# Patient Record
Sex: Male | Born: 1945 | Race: White | Hispanic: No | Marital: Married | Smoking: Former smoker
Health system: Southern US, Community
[De-identification: ages and names within clinical notes are randomized; demographics above are authoritative.]

## PROBLEM LIST (undated history)

## (undated) DIAGNOSIS — I10 Essential (primary) hypertension: Secondary | ICD-10-CM

## (undated) DIAGNOSIS — I639 Cerebral infarction, unspecified: Secondary | ICD-10-CM

## (undated) DIAGNOSIS — E119 Type 2 diabetes mellitus without complications: Secondary | ICD-10-CM

## (undated) DIAGNOSIS — C801 Malignant (primary) neoplasm, unspecified: Secondary | ICD-10-CM

## (undated) DIAGNOSIS — J449 Chronic obstructive pulmonary disease, unspecified: Secondary | ICD-10-CM

## (undated) HISTORY — PX: BACK SURGERY: SHX140

## (undated) HISTORY — PX: COLON SURGERY: SHX602

## (undated) HISTORY — PX: CARDIAC SURGERY: SHX584

---

## 2015-06-12 ENCOUNTER — Encounter (HOSPITAL_COMMUNITY): Payer: Self-pay | Admitting: Family Medicine

## 2015-06-12 ENCOUNTER — Emergency Department (HOSPITAL_COMMUNITY): Payer: Medicare Other

## 2015-06-12 ENCOUNTER — Emergency Department (HOSPITAL_COMMUNITY)
Admission: EM | Admit: 2015-06-12 | Discharge: 2015-06-12 | Disposition: A | Discharge status: Home / Self Care | Payer: Medicare Other | Attending: Emergency Medicine | Admitting: Emergency Medicine

## 2015-06-12 DIAGNOSIS — Y93K1 Activity, walking an animal: Secondary | ICD-10-CM | POA: Diagnosis not present

## 2015-06-12 DIAGNOSIS — Z87891 Personal history of nicotine dependence: Secondary | ICD-10-CM | POA: Diagnosis not present

## 2015-06-12 DIAGNOSIS — Y9289 Other specified places as the place of occurrence of the external cause: Secondary | ICD-10-CM | POA: Diagnosis not present

## 2015-06-12 DIAGNOSIS — I10 Essential (primary) hypertension: Secondary | ICD-10-CM | POA: Insufficient documentation

## 2015-06-12 DIAGNOSIS — R040 Epistaxis: Secondary | ICD-10-CM | POA: Insufficient documentation

## 2015-06-12 DIAGNOSIS — S62512A Displaced fracture of proximal phalanx of left thumb, initial encounter for closed fracture: Secondary | ICD-10-CM | POA: Insufficient documentation

## 2015-06-12 DIAGNOSIS — S29001A Unspecified injury of muscle and tendon of front wall of thorax, initial encounter: Secondary | ICD-10-CM | POA: Diagnosis present

## 2015-06-12 DIAGNOSIS — J449 Chronic obstructive pulmonary disease, unspecified: Secondary | ICD-10-CM | POA: Diagnosis not present

## 2015-06-12 DIAGNOSIS — E119 Type 2 diabetes mellitus without complications: Secondary | ICD-10-CM | POA: Diagnosis not present

## 2015-06-12 DIAGNOSIS — Z859 Personal history of malignant neoplasm, unspecified: Secondary | ICD-10-CM | POA: Diagnosis not present

## 2015-06-12 DIAGNOSIS — S20211A Contusion of right front wall of thorax, initial encounter: Secondary | ICD-10-CM | POA: Insufficient documentation

## 2015-06-12 DIAGNOSIS — Y998 Other external cause status: Secondary | ICD-10-CM | POA: Diagnosis not present

## 2015-06-12 DIAGNOSIS — S80211A Abrasion, right knee, initial encounter: Secondary | ICD-10-CM | POA: Diagnosis not present

## 2015-06-12 DIAGNOSIS — W19XXXA Unspecified fall, initial encounter: Secondary | ICD-10-CM

## 2015-06-12 DIAGNOSIS — S0990XA Unspecified injury of head, initial encounter: Secondary | ICD-10-CM | POA: Diagnosis not present

## 2015-06-12 DIAGNOSIS — W01198A Fall on same level from slipping, tripping and stumbling with subsequent striking against other object, initial encounter: Secondary | ICD-10-CM | POA: Insufficient documentation

## 2015-06-12 DIAGNOSIS — S50311A Abrasion of right elbow, initial encounter: Secondary | ICD-10-CM | POA: Insufficient documentation

## 2015-06-12 DIAGNOSIS — S62502A Fracture of unspecified phalanx of left thumb, initial encounter for closed fracture: Secondary | ICD-10-CM

## 2015-06-12 HISTORY — DX: Essential (primary) hypertension: I10

## 2015-06-12 HISTORY — DX: Malignant (primary) neoplasm, unspecified: C80.1

## 2015-06-12 HISTORY — DX: Type 2 diabetes mellitus without complications: E11.9

## 2015-06-12 HISTORY — DX: Chronic obstructive pulmonary disease, unspecified: J44.9

## 2015-06-12 HISTORY — DX: Cerebral infarction, unspecified: I63.9

## 2015-06-12 NOTE — Discharge Instructions (Signed)
Chest Contusion A chest contusion is a deep bruise on your chest area. Contusions are the result of an injury that caused bleeding under the skin. A chest contusion may involve bruising of the skin, muscles, or ribs. The contusion may turn blue, purple, or yellow. Minor injuries will give you a painless contusion, but more severe contusions may stay painful and swollen for a few weeks. CAUSES  A contusion is usually caused by a blow, trauma, or direct force to an area of the body. SYMPTOMS   Swelling and redness of the injured area.  Discoloration of the injured area.  Tenderness and soreness of the injured area.  Pain. DIAGNOSIS  The diagnosis can be made by taking a history and performing a physical exam. An X-ray, CT scan, or MRI may be needed to determine if there were any associated injuries, such as broken bones (fractures) or internal injuries. TREATMENT  Often, the best treatment for a chest contusion is resting, icing, and applying cold compresses to the injured area. Deep breathing exercises may be recommended to reduce the risk of pneumonia. Over-the-counter medicines may also be recommended for pain control. HOME CARE INSTRUCTIONS   Put ice on the injured area.  Put ice in a plastic bag.  Place a towel between your skin and the bag.  Leave the ice on for 15-20 minutes, 03-04 times a day.  Only take over-the-counter or prescription medicines as directed by your caregiver. Your caregiver may recommend avoiding anti-inflammatory medicines (aspirin, ibuprofen, and naproxen) for 48 hours because these medicines may increase bruising.  Rest the injured area.  Perform deep-breathing exercises as directed by your caregiver.  Stop smoking if you smoke.  Do not lift objects over 5 pounds (2.3 kg) for 3 days or longer if recommended by your caregiver. SEEK IMMEDIATE MEDICAL CARE IF:   You have increased bruising or swelling.  You have pain that is getting worse.  You have  difficulty breathing.  You have dizziness, weakness, or fainting.  You have blood in your urine or stool.  You cough up or vomit blood.  Your swelling or pain is not relieved with medicines. MAKE SURE YOU:   Understand these instructions.  Will watch your condition.  Will get help right away if you are not doing well or get worse.   This information is not intended to replace advice given to you by your health care provider. Make sure you discuss any questions you have with your health care provider.   Document Released: 10/12/2000 Document Revised: 10/12/2011 Document Reviewed: 07/11/2011 Elsevier Interactive Patient Education 2016 [employer]  

## 2015-06-12 NOTE — ED Notes (Signed)
Patient transported to X-ray 

## 2015-06-12 NOTE — Progress Notes (Signed)
Orthopedic Tech Progress Note Patient Details:  Jeffrey Ritter 1945/05/03 [id number]  Ortho Devices Type of Ortho Device: Thumb spica splint Splint Material: Plaster Ortho Device/Splint Interventions: Application   [place] Pink 06/12/2015, 12:40 PM

## 2015-06-12 NOTE — ED Provider Notes (Signed)
CSN: [id number]     Arrival date & time 06/12/15  0850 History   First MD Initiated Contact with Patient 06/12/15 [phone]     Chief Complaint  Patient presents with  . Fall     (Consider location/radiation/quality/duration/timing/severity/associated sxs/prior Treatment) Patient is a 70 y.o. male presenting with fall. The history is provided by the patient.  Fall This is a new (Patient is traveling from [place] to [facility] and they had stopped this morning at [facility] for breakfast and he was walking the dogs and they plan with him over causing him to fall face first into the pavement) problem. The current episode started less than 1 hour ago. The problem occurs constantly. The problem has not changed since onset.Associated symptoms include chest pain. Pertinent negatives include no abdominal pain, no headaches and no shortness of breath. Associated symptoms comments: Pain in the right lower anterior chest without shortness of breath. No headache, visual changes, LOC, nose pain. Left thumb pain and abrasions to the right elbow and knee. Nothing aggravates the symptoms. Nothing relieves the symptoms. He has tried nothing for the symptoms. The treatment provided no relief.    Past Medical History  Diagnosis Date  . Hypertension   . Diabetes mellitus without complication ([place])   . Stroke ([place])   . COPD (chronic obstructive pulmonary disease) ([place])   . Cancer [facility])    Past Surgical History  Procedure Laterality Date  . Cardiac surgery    . Back surgery    . Colon surgery     History reviewed. No pertinent family history. Social History  Substance Use Topics  . Smoking status: Former Research scientist (life sciences)  . Smokeless tobacco: None  . Alcohol Use: Yes    Review of Systems  Respiratory: Negative for shortness of breath.   Cardiovascular: Positive for chest pain.  Gastrointestinal: Negative for abdominal pain.  Neurological: Negative for headaches.  All other systems reviewed and are  negative.     Allergies  Review of patient's allergies indicates no known allergies.  Home Medications   Prior to Admission medications   Not on File   BP 127/76 mmHg  Pulse 51  Temp(Src) 98 F (36.7 C)  Resp 18  SpO2 98% Physical Exam  Constitutional: He is oriented to person, place, and time. He appears well-developed and well-nourished. No distress.  HENT:  Head: Normocephalic and atraumatic.  Mouth/Throat: Oropharynx is clear and moist.  Epistaxis which has spontaneously stopped out of bilateral nares  Eyes: Conjunctivae and EOM are normal. Pupils are equal, round, and reactive to light.  Neck: Normal range of motion. Neck supple. No spinous process tenderness and no muscular tenderness present.  Cardiovascular: Normal rate, regular rhythm and intact distal pulses.   No murmur heard. Pulmonary/Chest: Effort normal and breath sounds normal. No respiratory distress. He has no wheezes. He has no rales. He exhibits tenderness.    Abdominal: Soft. He exhibits no distension. There is no tenderness. There is no rebound and no guarding.  Musculoskeletal: Normal range of motion. He exhibits no edema.       Arms:      Left hand: He exhibits tenderness, bony tenderness and swelling. He exhibits no deformity.       Hands:      Legs: Superficial abrasions over the right elbow and knee with full range of motion of the joint without significant tenderness  Neurological: He is alert and oriented to person, place, and time.  Skin: Skin is warm and dry. No rash  noted. No erythema.  Psychiatric: He has a normal mood and affect. His behavior is normal.  Nursing note and vitals reviewed.   ED Course  Procedures (including critical care time) Labs Review Labs Reviewed - No data to display  Imaging Review Dg Ribs Unilateral W/chest Right  06/12/2015  CLINICAL DATA:  Tripped and fell.  Pain in the lateral right ribs. EXAM: RIGHT RIBS AND CHEST - 3+ VIEW COMPARISON:  None. FINDINGS:  There is a large curvilinear density in the mid and lower left lung. The left chest is more lucent lateral to this density. Findings could represent a large bulla formation but cannot exclude a pneumothorax at the left lung base. Otherwise, the lungs are clear. There is no evidence for right pneumothorax. Heart size is normal. There is an old fracture involving the right ninth rib. No evidence for an acute right rib fracture. IMPRESSION: No evidence for an acute right rib fracture. Old right ninth rib fracture. Lucency in the lateral left lower lung. Findings could be related to a large bulla formation but the cannot exclude a complex or loculated pneumothorax in this location. Recommend further characterization with CT or decubitus images. Electronically Signed   By: Markus Daft M.D.   On: 06/12/2015 10:16   Ct Head Wo Contrast  06/12/2015  CLINICAL DATA:  [place] and struck head on pavement. Complains of headache. EXAM: CT HEAD WITHOUT CONTRAST TECHNIQUE: Contiguous axial images were obtained from the base of the skull through the vertex without contrast. COMPARISON:  None FINDINGS: There is a large area of encephalomalacia in the right occipital lobe extending into the right parietal lobe. Findings compatible with old infarct. There is also ex vacuo dilatation of the posterior right lateral ventricle. Low-density in the white matter suggesting chronic changes. No evidence for acute hemorrhage, mass lesion, midline shift, hydrocephalus or large infarct. Mastoid air cells are aerated. Mild mucosal thickening in the mastoid air cells and sphenoid sinuses. No calvarial fracture. IMPRESSION: No acute intracranial abnormality. 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 Evidence for chronic small vessel ischemic changes. Electronically Signed   By: Markus Daft M.D.   On: 06/12/2015 10:22   Dg Chest Left Decubitus  06/12/2015  CLINICAL DATA:  Status post fall today with a chest injury and pain. Initial encounter. EXAM:  CHEST - LEFT DECUBITUS COMPARISON:  Plain films of the chest and right ribs this same day. FINDINGS: No pneumothorax is identified. Large bullous lesion in the right lower lung zone is noted. No pleural effusion. IMPRESSION: Negative for pneumothorax. Large bullous lesion right lower lung zone. Electronically Signed   By: Inge Rise M.D.   On: 06/12/2015 11:36   Dg Finger Thumb Left  06/12/2015  CLINICAL DATA:  Pain following fall EXAM: LEFT THUMB 2+V COMPARISON:  None. FINDINGS: Frontal, oblique, and lateral views were obtained. There is a small avulsion along the volar aspect of the proximal most portion of the first metacarpal with alignment near anatomic. No other fracture. No dislocation. No appreciable joint space narrowing. IMPRESSION: Avulsion along the volar aspect of the proximal most portion of the first metacarpal in near anatomic alignment. No other fracture. No dislocation. No apparent arthropathy. Electronically Signed   By: Lowella Grip III M.D.   On: 06/12/2015 10:16   I have personally reviewed and evaluated these images and lab results as part of my medical decision-making.   EKG Interpretation None      MDM   Final diagnoses:  Fall, initial encounter  Rib contusion, right, initial encounter  Thumb fracture, left, closed, initial encounter    Patient is a 70 year old male who had a mechanical fall today when he was pulled over while walking his dogs. He is complaining of into the right ribs and has abrasions to the right knee and elbow. Also he hit his face and forehead and he does take Eliquis.  Patient denies LOC and currently denies headache.  Patient was able to family without difficulty after the fall. He does complain of left thumb pain.   X-ray shows small avulsion fracture of the left thumb with a normal head CT. Chest x-ray to evaluate for rib fractures is negative on the right for rib fractures but concern for a bone marrow versus a complex pneumothorax  on the left. We will get a decubitus image however feel this is highly unlikely this patient has no shortness of breath no left-sided chest pain and is satting 98% on room air.  Tetanus shot is up-to-date and patient's wounds were cleaned and dressed.  Blanchie Dessert, MD 06/12/15 [phone]

## 2015-06-12 NOTE — ED Notes (Signed)
Pt here for fall this am. sts that he got tripped up on his dogs. sts struck face, bilateral arms and right knee. Denies LOC. sts slight headache. Pt on blood thinner.

## 2017-09-06 IMAGING — DX DG RIBS W/ CHEST 3+V*R*
3 series · 3 of 3 positions shown · non-contrast
Comparison: None.

CLINICAL DATA: Tripped and fell.  Pain in the lateral right ribs.

EXAM:
RIGHT RIBS AND CHEST - 3+ VIEW

[x chest ap]
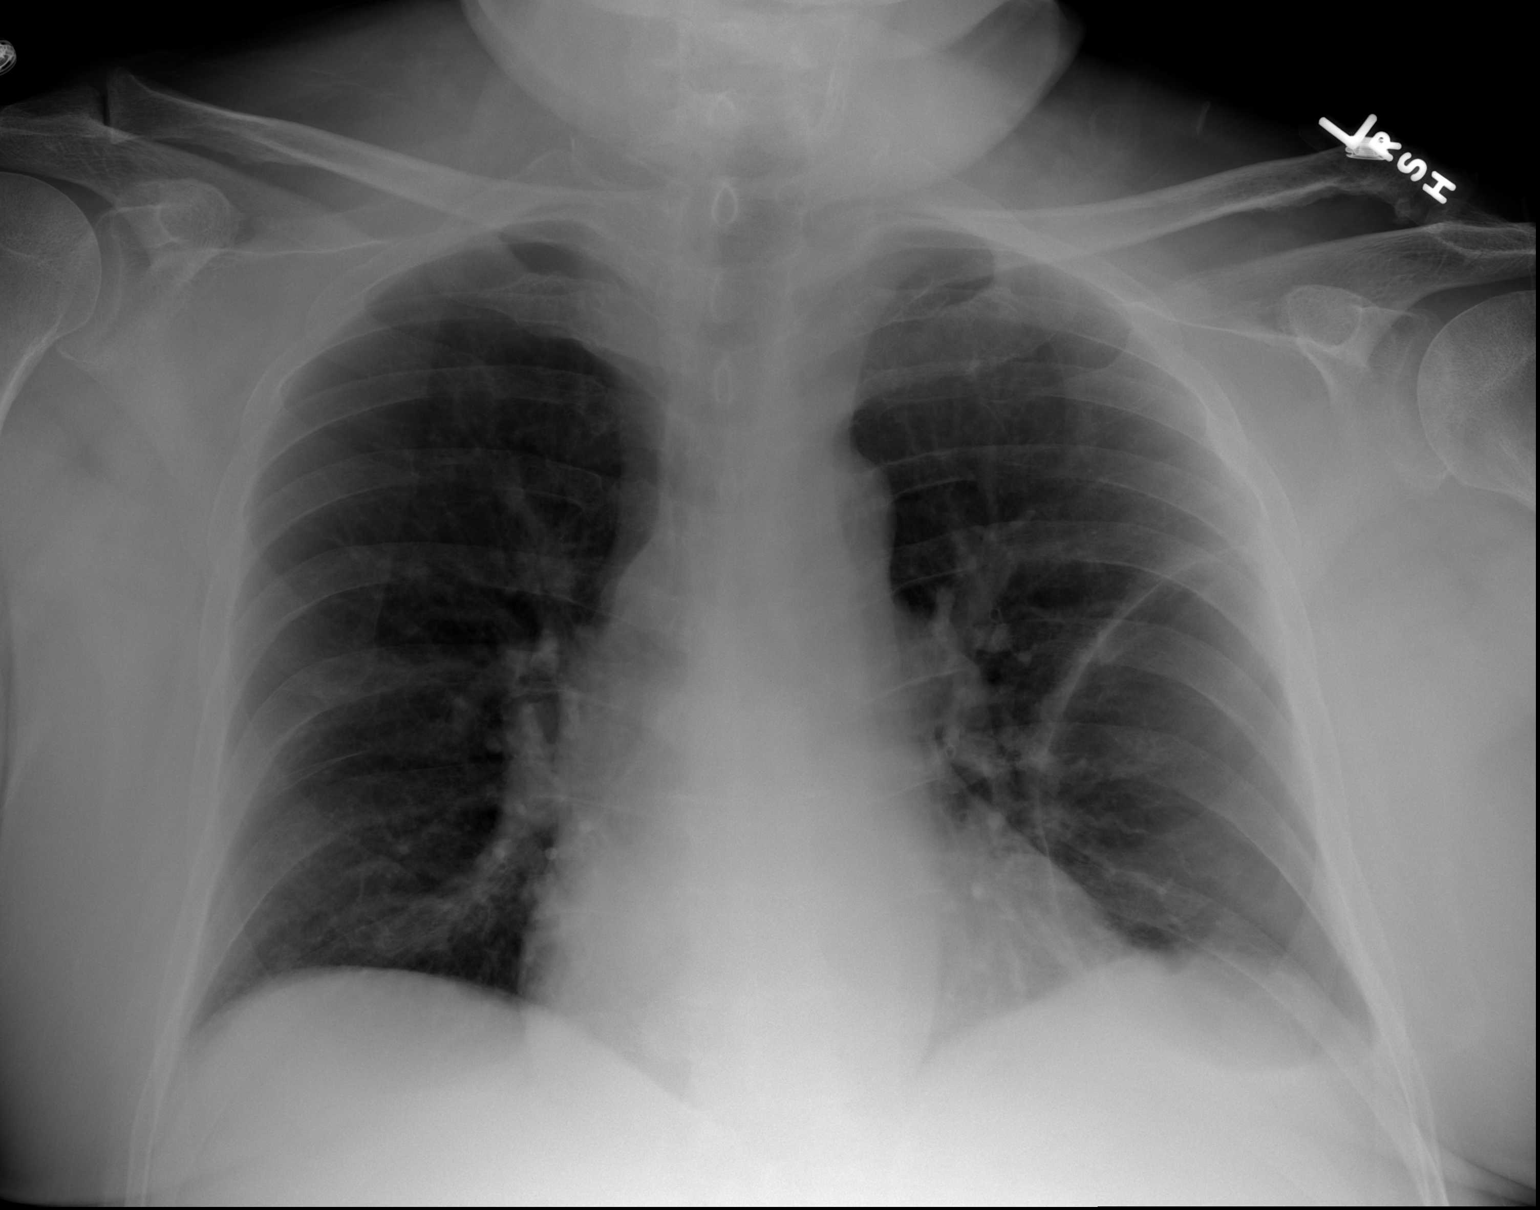

[t ribs ap upper right]
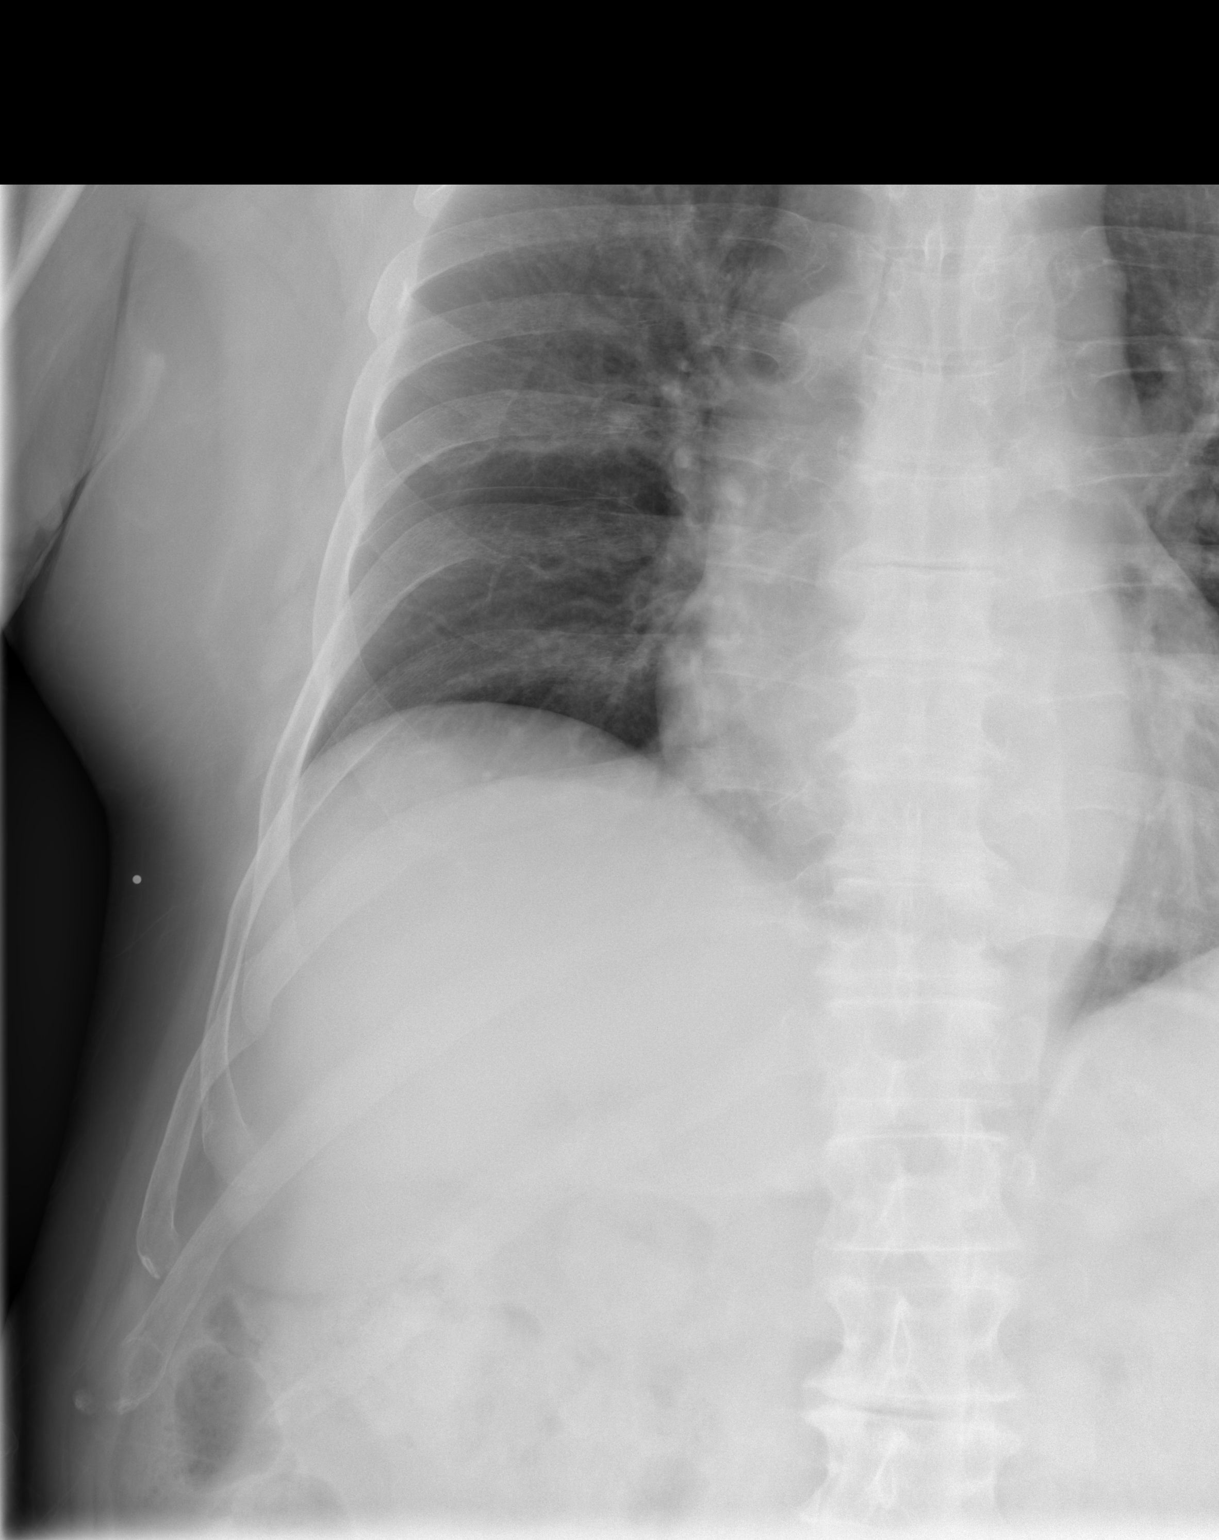

[t ribs rpo right]
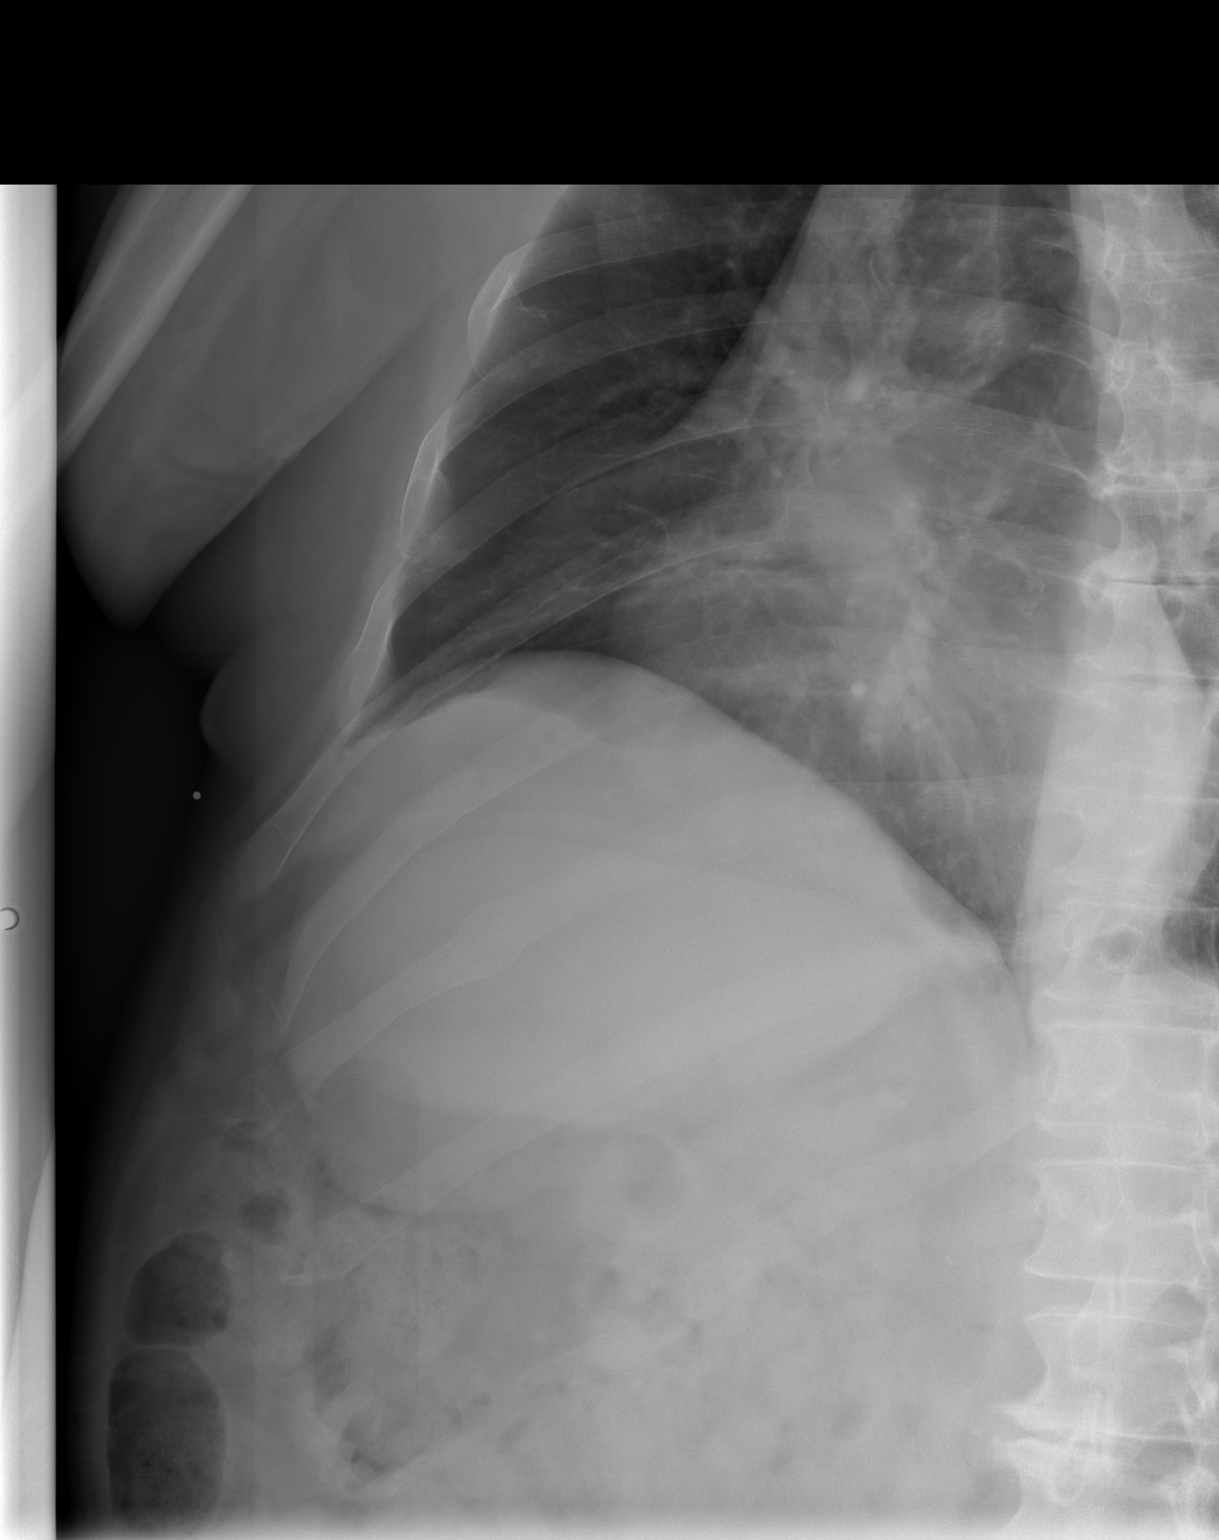

[3 of 3 positions shown; findings below may reference images not displayed]

FINDINGS: There is a large curvilinear density in the mid and lower left lung.
The left chest is more lucent lateral to this density. Findings
could represent a large bulla formation but cannot exclude a
pneumothorax at the left lung base. Otherwise, the lungs are clear.
There is no evidence for right pneumothorax. Heart size is normal.
There is an old fracture involving the right ninth rib. No evidence
for an acute right rib fracture.
IMPRESSION: No evidence for an acute right rib fracture. Old right ninth rib
fracture.

Lucency in the lateral left lower lung. Findings could be related to
a large bulla formation but the cannot exclude a complex or
loculated pneumothorax in this location. Recommend further
characterization with CT or decubitus images.

## 2021-05-01 DEATH — deceased
